# Patient Record
Sex: Male | Born: 1995 | Race: Black or African American | Hispanic: No | Marital: Single | Smoking: Never smoker
Health system: Southern US, Community
[De-identification: ages and names within clinical notes are randomized; demographics above are authoritative.]

## PROBLEM LIST (undated history)

## (undated) DIAGNOSIS — I10 Essential (primary) hypertension: Secondary | ICD-10-CM

## (undated) DIAGNOSIS — R519 Headache, unspecified: Secondary | ICD-10-CM

## (undated) DIAGNOSIS — R200 Anesthesia of skin: Secondary | ICD-10-CM

## (undated) DIAGNOSIS — G5601 Carpal tunnel syndrome, right upper limb: Secondary | ICD-10-CM

## (undated) HISTORY — DX: Anesthesia of skin: R20.0

## (undated) HISTORY — DX: Carpal tunnel syndrome, right upper limb: G56.01

## (undated) HISTORY — DX: Headache, unspecified: R51.9

## (undated) HISTORY — PX: OTHER SURGICAL HISTORY: SHX169

---

## 2020-07-11 ENCOUNTER — Other Ambulatory Visit: Payer: Self-pay

## 2020-07-11 ENCOUNTER — Emergency Department (HOSPITAL_COMMUNITY)
Admission: EM | Admit: 2020-07-11 | Discharge: 2020-07-11 | Disposition: A | Discharge status: Home / Self Care | Attending: Emergency Medicine | Admitting: Emergency Medicine

## 2020-07-11 ENCOUNTER — Encounter (HOSPITAL_COMMUNITY): Payer: Self-pay | Admitting: Emergency Medicine

## 2020-07-11 ENCOUNTER — Other Ambulatory Visit (HOSPITAL_COMMUNITY): Admission: RE | Admit: 2020-07-11 | Source: Ambulatory Visit

## 2020-07-11 ENCOUNTER — Emergency Department (HOSPITAL_COMMUNITY)

## 2020-07-11 DIAGNOSIS — R072 Precordial pain: Secondary | ICD-10-CM | POA: Diagnosis not present

## 2020-07-11 DIAGNOSIS — Z20822 Contact with and (suspected) exposure to covid-19: Secondary | ICD-10-CM | POA: Insufficient documentation

## 2020-07-11 DIAGNOSIS — Z79899 Other long term (current) drug therapy: Secondary | ICD-10-CM | POA: Diagnosis not present

## 2020-07-11 DIAGNOSIS — R0789 Other chest pain: Secondary | ICD-10-CM | POA: Insufficient documentation

## 2020-07-11 DIAGNOSIS — I10 Essential (primary) hypertension: Secondary | ICD-10-CM | POA: Insufficient documentation

## 2020-07-11 HISTORY — DX: Essential (primary) hypertension: I10

## 2020-07-11 LAB — RESP PANEL BY RT-PCR (FLU A&B, COVID) ARPGX2
Influenza A by PCR: NEGATIVE
Influenza B by PCR: NEGATIVE
SARS Coronavirus 2 by RT PCR: NEGATIVE

## 2020-07-11 LAB — BASIC METABOLIC PANEL
Anion gap: 6 (ref 5–15)
BUN: 11 mg/dL (ref 6–20)
CO2: 29 mmol/L (ref 22–32)
Calcium: 9.6 mg/dL (ref 8.9–10.3)
Chloride: 100 mmol/L (ref 98–111)
Creatinine, Ser: 0.96 mg/dL (ref 0.61–1.24)
GFR, Estimated: >60 mL/min (ref >60)
Glucose, Bld: 147 mg/dL — ABNORMAL HIGH (ref 70–99)
Potassium: 3.4 mmol/L — ABNORMAL LOW (ref 3.5–5.1)
Sodium: 135 mmol/L (ref 135–145)

## 2020-07-11 LAB — CBC
HCT: 46.5 % (ref 39.0–52.0)
Hemoglobin: 16 g/dL (ref 13.0–17.0)
MCH: 30.2 pg (ref 26.0–34.0)
MCHC: 34.4 g/dL (ref 30.0–36.0)
MCV: 87.7 fL (ref 80.0–100.0)
Platelets: 263 10*3/uL (ref 150–400)
RBC: 5.3 MIL/uL (ref 4.22–5.81)
RDW: 13.4 % (ref 11.5–15.5)
WBC: 6 10*3/uL (ref 4.0–10.5)
nRBC: 0 % (ref 0.0–0.2)

## 2020-07-11 LAB — CBG MONITORING, ED: Glucose-Capillary: 133 mg/dL — ABNORMAL HIGH (ref 70–99)

## 2020-07-11 LAB — TROPONIN I (HIGH SENSITIVITY)
Troponin I (High Sensitivity): 2 ng/L (ref <18)
Troponin I (High Sensitivity): <2 ng/L (ref <18)

## 2020-07-11 MED ORDER — NAPROXEN 500 MG PO TABS: ORAL_TABLET | ORAL | 0 refills | Status: AC

## 2020-07-11 NOTE — Discharge Instructions (Addendum)
Follow-up with your family doctor next week for recheck. 

## 2020-07-11 NOTE — ED Triage Notes (Signed)
Pt c/o chest tightness for 3 days that has been intermittent but constant since last night. Reports sweating and nausea.

## 2020-07-11 NOTE — ED Provider Notes (Signed)
[facility]-EMERGENCY DEPT Provider Note   CSN: [id number] Arrival date & time: 07/11/20  1126     History Chief Complaint  Patient presents with  . Chest Pain    Caleb Patton is a 25 y.o. male.  Patient complains of chest discomfort for the last 3 days with it being worse for last 12 hours.  Patient has a history of hypertension.  No shortness of breath no sweating  The history is provided by the patient and medical records. No language interpreter was used.  Chest Pain Pain location:  Substernal area Pain quality: aching   Pain radiates to:  Does not radiate Pain severity:  Moderate Onset quality:  Sudden Timing:  Constant Progression:  Worsening Chronicity:  New Context: not breathing   Associated symptoms: no abdominal pain, no back pain, no cough, no fatigue and no headache        Past Medical History:  Diagnosis Date  . Hypertension     There are no problems to display for this patient.   History reviewed. No pertinent surgical history.     No family history on file.  Social History   Tobacco Use  . Smoking status: Never Smoker  . Smokeless tobacco: Never Used  Vaping Use  . Vaping Use: Every day  . Substances: Nicotine    Home Medications Prior to Admission medications   Medication Sig Start Date End Date Taking? Authorizing Provider  hydrochlorothiazide (HYDRODIURIL) 25 MG tablet Take 12.5 mg by mouth daily.   Yes [provider]  lisinopril (ZESTRIL) 20 MG tablet Take 20 mg by mouth daily.   Yes [provider]  naproxen (NAPROSYN) 500 MG tablet Take 1 pill every 12 hours if needed for pain not relieved by Tylenol 07/11/20  Yes Bethann Berkshire, MD    Allergies    Patient has no known allergies.  Review of Systems   Review of Systems  Constitutional: Negative for appetite change and fatigue.  HENT: Negative for congestion, ear discharge and sinus pressure.   Eyes: Negative for discharge.   Respiratory: Negative for cough.   Cardiovascular: Positive for chest pain.  Gastrointestinal: Negative for abdominal pain and diarrhea.  Genitourinary: Negative for frequency and hematuria.  Musculoskeletal: Negative for back pain.  Skin: Negative for rash.  Neurological: Negative for seizures and headaches.  Psychiatric/Behavioral: Negative for hallucinations.    Physical Exam Updated Vital Signs BP 125/61 (BP Location: Right Arm)   Pulse 82   Temp 98 F (36.7 C)   Resp 15   Ht 5\' 5"  (1.651 m)   Wt 83.4 kg   SpO2 99%   BMI 30.60 kg/m   Physical Exam Vitals and nursing note reviewed.  Constitutional:      Appearance: He is well-developed.  HENT:     Head: Normocephalic.     Mouth/Throat:     Mouth: Mucous membranes are moist.  Eyes:     General: No scleral icterus.    Conjunctiva/sclera: Conjunctivae normal.  Neck:     Thyroid: No thyromegaly.  Cardiovascular:     Rate and Rhythm: Normal rate and regular rhythm.     Heart sounds: No murmur heard. No friction rub. No gallop.   Pulmonary:     Breath sounds: No stridor. No wheezing or rales.  Chest:     Chest wall: No tenderness.  Abdominal:     General: There is no distension.     Tenderness: There is no abdominal tenderness. There is no rebound.  Musculoskeletal:        General: Normal range of motion.     Cervical back: Neck supple.  Lymphadenopathy:     Cervical: No cervical adenopathy.  Skin:    Findings: No erythema or rash.  Neurological:     Mental Status: He is alert and oriented to person, place, and time.     Motor: No abnormal muscle tone.     Coordination: Coordination normal.  Psychiatric:        Behavior: Behavior normal.     ED Results / Procedures / Treatments   Labs (all labs ordered are listed, but only abnormal results are displayed) Labs Reviewed  BASIC METABOLIC PANEL - Abnormal; Notable for the following components:      Result Value   Potassium 3.4 (*)    Glucose, Bld 147  (*)    All other components within normal limits  CBG MONITORING, ED - Abnormal; Notable for the following components:   Glucose-Capillary 133 (*)    All other components within normal limits  RESP PANEL BY RT-PCR (FLU A&B, COVID) ARPGX2  CBC  TROPONIN I (HIGH SENSITIVITY)  TROPONIN I (HIGH SENSITIVITY)    EKG  Radiology DG Chest 2 View  Result Date: 07/11/2020 CLINICAL DATA:  Chest pain for 3 days. EXAM: CHEST - 2 VIEW COMPARISON:  None. FINDINGS: The cardiac silhouette, mediastinal and hilar contours are normal. The lungs are clear. No pleural effusions. The bony thorax is intact. IMPRESSION: No acute cardiopulmonary findings. Electronically Signed   By: Rudie Meyer M.D.   On: 07/11/2020 13:11    Procedures Procedures   Medications Ordered in ED Medications - No data to display  ED Course  I have reviewed the triage vital signs and the nursing notes.  Pertinent labs & imaging results that were available during my care of the patient were reviewed by me and considered in my medical decision making (see chart for details). Initial EKG had some ST elevation that looked more like repolarization.  I spoke with cardiology and they agreed with that.  Labs and x-rays unremarkable.  This is not cardiac chest discomfort.  He is sent home with some Naprosyn will follow up with PCP   MDM Rules/Calculators/A&P                          Atypical chest pain.  Patient given Naprosyn and will follow up with PCP Final Clinical Impression(s) / ED Diagnoses Final diagnoses:  Atypical chest pain    Rx / DC Orders ED Discharge Orders         Ordered    naproxen (NAPROSYN) 500 MG tablet        07/11/20 1504           Bethann Berkshire, MD 07/14/20 1708

## 2020-07-13 ENCOUNTER — Encounter (HOSPITAL_COMMUNITY): Payer: Self-pay | Admitting: Emergency Medicine

## 2020-07-13 ENCOUNTER — Ambulatory Visit (HOSPITAL_COMMUNITY)
Admission: EM | Admit: 2020-07-13 | Discharge: 2020-07-13 | Disposition: A | Discharge status: Home / Self Care | Attending: Emergency Medicine | Admitting: Emergency Medicine

## 2020-07-13 ENCOUNTER — Other Ambulatory Visit: Payer: Self-pay

## 2020-07-13 DIAGNOSIS — R5383 Other fatigue: Secondary | ICD-10-CM | POA: Diagnosis not present

## 2020-07-13 DIAGNOSIS — R634 Abnormal weight loss: Secondary | ICD-10-CM | POA: Insufficient documentation

## 2020-07-13 LAB — TSH: TSH: 1.23 u[IU]/mL (ref 0.350–4.500)

## 2020-07-13 LAB — HEPATITIS PANEL, ACUTE
HCV Ab: NONREACTIVE
Hep A IgM: NONREACTIVE
Hep B C IgM: NONREACTIVE
Hepatitis B Surface Ag: NONREACTIVE

## 2020-07-13 LAB — POCT URINALYSIS DIPSTICK, ED / UC
Bilirubin Urine: NEGATIVE
Glucose, UA: NEGATIVE mg/dL
Hgb urine dipstick: NEGATIVE
Ketones, ur: NEGATIVE mg/dL
Leukocytes,Ua: NEGATIVE
Nitrite: NEGATIVE
Protein, ur: NEGATIVE mg/dL
Specific Gravity, Urine: 1.01 (ref 1.005–1.030)
Urobilinogen, UA: 0.2 mg/dL (ref 0.0–1.0)
pH: 6 (ref 5.0–8.0)

## 2020-07-13 LAB — HIV ANTIBODY (ROUTINE TESTING W REFLEX): HIV Screen 4th Generation wRfx: NONREACTIVE

## 2020-07-13 NOTE — Discharge Instructions (Addendum)
Today you are being checked for gonorrhea, chlamydia, trichomonas, hepatitis, HIV, syphilis and your thyroid, labs pending 2 to 3 days, you will be notified of any concerning results  Please refrain from sex until STD labs return, if positive for anything please refrain from sex until treatment complete, please notify partner if positive so that they may be treated as well  Follow-up with primary care provider if symptoms persist past 1 week  Please weigh self daily at the same time and record, so that your physician will have a track record of your weight loss

## 2020-07-13 NOTE — ED Triage Notes (Signed)
Patient reports feeling fatigued on Monday.  Patient seen at [facility] long ED on 07/11/2020 for different concerns.  Patient has fatigue, concerns for weight loss.  Patient suppo0rts this concern with a 6 pound loss since Monday.  Patient is asking for std testing.  Patient denies symptoms.

## 2020-07-13 NOTE — ED Provider Notes (Signed)
[facility]    CSN: [id number] Arrival date & time: 07/13/20  1114      History   Chief Complaint Chief Complaint  Patient presents with  . Fatigue  . Weight Loss  . SEXUALLY TRANSMITTED DISEASE    HPI Caleb Patton is a 25 y.o. male.   Patient presents with fatigue, rapid weight loss and body aches beginning Monday. Weighed 188 on Sunday, today weighed at 181. Works out 1-2 hours daily. Denies dietary changes and that recently has been snacking more and eating fast foods, Denies fever, headaches, N/V/D, abdominal pain, shortness of breath, recent travel. Recently seen for chest pain in ED on 5/10 where complete work up was done. Requesting sti screening. Unsure if this may be related. 1 partner, no condom use. Last encounter 3 weeks ago. Denies discharge, itching,dysuria, penile or scrotal swelling, rash or lesions.   Past Medical History:  Diagnosis Date  . Hypertension     There are no problems to display for this patient.   History reviewed. No pertinent surgical history.     Home Medications    Prior to Admission medications   Medication Sig Start Date End Date Taking? Authorizing Provider  azithromycin (ZITHROMAX) 250 MG tablet Take by mouth daily.   Yes [provider]  hydrochlorothiazide (HYDRODIURIL) 25 MG tablet Take 12.5 mg by mouth daily.   Yes [provider]  lisinopril (ZESTRIL) 20 MG tablet Take 20 mg by mouth daily.   Yes [provider]  naproxen (NAPROSYN) 500 MG tablet Take 1 pill every 12 hours if needed for pain not relieved by Tylenol 07/11/20  Yes Bethann Berkshire, MD    Family History Family History  Problem Relation Age of Onset  . Hypertension Mother   . Cancer Mother   . Hypertension Father     Social History Social History   Tobacco Use  . Smoking status: Never Smoker  . Smokeless tobacco: Never Used  Vaping Use  . Vaping Use: Every day  . Substances: Nicotine  Substance Use Topics  .  Alcohol use: Never  . Drug use: Never     Allergies   Patient has no known allergies.   Review of Systems Review of Systems  Constitutional: Positive for fatigue and unexpected weight change. Negative for activity change, appetite change, chills, diaphoresis and fever.  HENT: Negative.   Respiratory: Negative.   Cardiovascular: Negative.   Gastrointestinal: Negative.   Musculoskeletal: Negative.   Skin: Negative.   Neurological: Negative.      Physical Exam Triage Vital Signs ED Triage Vitals  Enc Vitals Group     BP 07/13/20 1157 138/89     Pulse Rate 07/13/20 1157 (!) 111     Resp 07/13/20 1157 20     Temp 07/13/20 1157 98.5 F (36.9 C)     Temp Source 07/13/20 1157 Oral     SpO2 07/13/20 1157 100 %     Weight --      Height --      Head Circumference --      Peak Flow --      Pain Score 07/13/20 1153 2     Pain Loc --      Pain Edu? --      Excl. in GC? --    No data found.  Updated Vital Signs BP 138/89 (BP Location: Left Arm) Comment (BP Location): large cuff  Pulse (!) 111   Temp 98.5 F (36.9 C) (Oral)   Resp  20   SpO2 100%   Visual Acuity Right Eye Distance:   Left Eye Distance:   Bilateral Distance:    Right Eye Near:   Left Eye Near:    Bilateral Near:     Physical Exam Constitutional:      Appearance: Normal appearance. He is normal weight.  HENT:     Head: Normocephalic.     Right Ear: Tympanic membrane, ear canal and external ear normal.     Left Ear: Tympanic membrane, ear canal and external ear normal.     Nose: Nose normal.     Mouth/Throat:     Mouth: Mucous membranes are moist.     Pharynx: Oropharynx is clear.  Eyes:     Extraocular Movements: Extraocular movements intact.     Conjunctiva/sclera: Conjunctivae normal.     Pupils: Pupils are equal, round, and reactive to light.  Cardiovascular:     Rate and Rhythm: Normal rate and regular rhythm.     Pulses: Normal pulses.     Heart sounds: Normal heart sounds.   Pulmonary:     Effort: Pulmonary effort is normal.     Breath sounds: Normal breath sounds.  Musculoskeletal:        General: Normal range of motion.     Cervical back: Normal range of motion and neck supple.  Skin:    General: Skin is warm and dry.  Neurological:     General: No focal deficit present.     Mental Status: He is alert and oriented to person, place, and time. Mental status is at baseline.  Psychiatric:        Mood and Affect: Mood normal.        Behavior: Behavior normal.        Thought Content: Thought content normal.        Judgment: Judgment normal.      UC Treatments / Results  Labs (all labs ordered are listed, but only abnormal results are displayed) Labs Reviewed  HIV ANTIBODY (ROUTINE TESTING W REFLEX)  RPR  HEPATITIS PANEL, ACUTE  TSH  POCT URINALYSIS DIPSTICK, ED / UC  CYTOLOGY, (ORAL, ANAL, URETHRAL) ANCILLARY ONLY    EKG   Radiology No results found.  Procedures Procedures (including critical care time)  Medications Ordered in UC Medications - No data to display  Initial Impression / Assessment and Plan / UC Course  I have reviewed the triage vital signs and the nursing notes.  Pertinent labs & imaging results that were available during my care of the patient were reviewed by me and considered in my medical decision making (see chart for details).  Fatigue  Loss of weight  Reviewed all lab work for ED visit on 07/11/20. Unremarkable.   1. STI screening pending, advised abstinence until labs result and/or treatment complete 2. Urinalysis- negative 3. TSH- pending 4. Hepatitis panel - pending 5. Follow up in 1 week with PCP if symptoms still present   Final Clinical Impressions(s) / UC Diagnoses   Final diagnoses:  Fatigue, unspecified type  Loss of weight     Discharge Instructions     Today you are being checked for gonorrhea, chlamydia, trichomonas, hepatitis, HIV, syphilis and your thyroid, labs pending 2 to 3 days,  you will be notified of any concerning results  Please refrain from sex until STD labs return, if positive for anything please refrain from sex until treatment complete, please notify partner if positive so that they may be treated as well  Follow-up with primary care provider if symptoms persist past 1 week  Please weigh self daily at the same time and record, so that your physician will have a track record of your weight loss   ED Prescriptions    None     PDMP not reviewed this encounter.   Valinda Hoar, NP 07/13/20 1329

## 2020-07-14 LAB — CYTOLOGY, (ORAL, ANAL, URETHRAL) ANCILLARY ONLY
Chlamydia: NEGATIVE
Comment: NEGATIVE
Comment: NEGATIVE
Comment: NORMAL
Neisseria Gonorrhea: NEGATIVE
Trichomonas: NEGATIVE

## 2020-07-14 LAB — RPR: RPR Ser Ql: NONREACTIVE

## 2020-07-17 ENCOUNTER — Emergency Department (HOSPITAL_COMMUNITY)
Admission: EM | Admit: 2020-07-17 | Discharge: 2020-07-17 | Disposition: A | Discharge status: Home / Self Care | Attending: Emergency Medicine | Admitting: Emergency Medicine

## 2020-07-17 ENCOUNTER — Encounter (HOSPITAL_COMMUNITY): Payer: Self-pay | Admitting: Emergency Medicine

## 2020-07-17 ENCOUNTER — Other Ambulatory Visit: Payer: Self-pay

## 2020-07-17 ENCOUNTER — Ambulatory Visit: Payer: Self-pay | Admitting: *Deleted

## 2020-07-17 DIAGNOSIS — R202 Paresthesia of skin: Secondary | ICD-10-CM

## 2020-07-17 DIAGNOSIS — E538 Deficiency of other specified B group vitamins: Secondary | ICD-10-CM

## 2020-07-17 DIAGNOSIS — I10 Essential (primary) hypertension: Secondary | ICD-10-CM | POA: Diagnosis not present

## 2020-07-17 DIAGNOSIS — R519 Headache, unspecified: Secondary | ICD-10-CM | POA: Diagnosis not present

## 2020-07-17 DIAGNOSIS — R29810 Facial weakness: Secondary | ICD-10-CM | POA: Insufficient documentation

## 2020-07-17 LAB — CBC WITH DIFFERENTIAL/PLATELET
Abs Immature Granulocytes: 0.02 10*3/uL (ref 0.00–0.07)
Basophils Absolute: 0 10*3/uL (ref 0.0–0.1)
Basophils Relative: 1 %
Eosinophils Absolute: 0.1 10*3/uL (ref 0.0–0.5)
Eosinophils Relative: 2 %
HCT: 42.9 % (ref 39.0–52.0)
Hemoglobin: 14.5 g/dL (ref 13.0–17.0)
Immature Granulocytes: 0 %
Lymphocytes Relative: 48 %
Lymphs Abs: 2.9 10*3/uL (ref 0.7–4.0)
MCH: 30.1 pg (ref 26.0–34.0)
MCHC: 33.8 g/dL (ref 30.0–36.0)
MCV: 89 fL (ref 80.0–100.0)
Monocytes Absolute: 0.5 10*3/uL (ref 0.1–1.0)
Monocytes Relative: 8 %
Neutro Abs: 2.4 10*3/uL (ref 1.7–7.7)
Neutrophils Relative %: 41 %
Platelets: 235 10*3/uL (ref 150–400)
RBC: 4.82 MIL/uL (ref 4.22–5.81)
RDW: 13.1 % (ref 11.5–15.5)
WBC: 6 10*3/uL (ref 4.0–10.5)
nRBC: 0 % (ref 0.0–0.2)

## 2020-07-17 LAB — BASIC METABOLIC PANEL
Anion gap: 6 (ref 5–15)
BUN: 14 mg/dL (ref 6–20)
CO2: 30 mmol/L (ref 22–32)
Calcium: 9.2 mg/dL (ref 8.9–10.3)
Chloride: 100 mmol/L (ref 98–111)
Creatinine, Ser: 0.73 mg/dL (ref 0.61–1.24)
GFR, Estimated: >60 mL/min (ref >60)
Glucose, Bld: 102 mg/dL — ABNORMAL HIGH (ref 70–99)
Potassium: 3.4 mmol/L — ABNORMAL LOW (ref 3.5–5.1)
Sodium: 136 mmol/L (ref 135–145)

## 2020-07-17 LAB — MAGNESIUM: Magnesium: 2.1 mg/dL (ref 1.7–2.4)

## 2020-07-17 LAB — VITAMIN B12: Vitamin B-12: 111 pg/mL — ABNORMAL LOW (ref 180–914)

## 2020-07-17 MED ORDER — POTASSIUM CHLORIDE CRYS ER 20 MEQ PO TBCR
40.0000 meq | EXTENDED_RELEASE_TABLET | Freq: Once | ORAL | Status: AC
  Administered 2020-07-17: 40 meq via ORAL
  Filled 2020-07-17: qty 2

## 2020-07-17 MED ORDER — DIPHENHYDRAMINE HCL 50 MG/ML IJ SOLN
25.0000 mg | Freq: Once | INTRAMUSCULAR | Status: AC
  Administered 2020-07-17: 25 mg via INTRAVENOUS
  Filled 2020-07-17: qty 1

## 2020-07-17 MED ORDER — VITAMIN B-12 1000 MCG PO TABS: 1000.0000 ug | ORAL_TABLET | Freq: Every day | ORAL | 0 refills | Status: AC

## 2020-07-17 MED ORDER — METOCLOPRAMIDE HCL 5 MG/ML IJ SOLN
10.0000 mg | Freq: Once | INTRAMUSCULAR | Status: AC
  Administered 2020-07-17: 10 mg via INTRAVENOUS
  Filled 2020-07-17: qty 2

## 2020-07-17 NOTE — ED Provider Notes (Signed)
Care assumed from [facility], PA-C at shift change pending labs. See her note for full HPI.  In short, patient is a 25 year old male who presents to the ED due to left-sided facial numbness which has been intermittent in nature for the past few days.  Patient states symptoms worsened on Sunday and became more constant.  Left-sided facial paresthesias associated with headache.  Patient has a history of migraines.  No photophobia phonophobia, nausea, vomiting, visual changes, speech changes, unilateral weakness.  No family history of MS.  Plan from previous provider: Follow-up with labs. If labs are normal and numbness/tingling improve after migraine cocktail, discharge with outpatient neurology follow-up. If labs normal and numbness/tingling continues after treatment reassess for possible MRI brain to rule out MS.    Physical Exam  BP 136/78   Pulse 66   Temp 98.4 F (36.9 C) (Oral)   Resp 20   Ht 5\' 6"  (1.676 m)   Wt 83.9 kg   SpO2 99%   BMI 29.86 kg/m   Physical Exam Vitals and nursing note reviewed.  Constitutional:      General: He is not in acute distress.    Appearance: He is not ill-appearing.  HENT:     Head: Normocephalic.     Mouth/Throat:     Comments: Mild decrease in facial sensation on left. No facial droop.  Eyes:     Extraocular Movements: Extraocular movements intact.     Pupils: Pupils are equal, round, and reactive to light.  Cardiovascular:     Rate and Rhythm: Normal rate and regular rhythm.     Pulses: Normal pulses.     Heart sounds: Normal heart sounds. No murmur heard. No friction rub. No gallop.   Pulmonary:     Effort: Pulmonary effort is normal.     Breath sounds: Normal breath sounds.  Abdominal:     General: Abdomen is flat. There is no distension.     Palpations: Abdomen is soft.     Tenderness: There is no abdominal tenderness. There is no guarding or rebound.  Musculoskeletal:        General: Normal range of motion.     Cervical  back: Neck supple.  Skin:    General: Skin is warm and dry.  Neurological:     General: No focal deficit present.     Mental Status: He is alert.     Comments: Speech is clear, able to follow commands CN III-XII intact Normal strength in upper and lower extremities bilaterally including dorsiflexion and plantar flexion, strong and equal grip strength Sensation grossly intact throughout Moves extremities without ataxia, coordination intact No pronator drift Ambulates without difficulty  Psychiatric:        Mood and Affect: Mood normal.        Behavior: Behavior normal.     ED Course/Procedures   Clinical Course as of 07/17/20 0804  Mon Jul 17, 2020  Jul 19, 2020 Vitamin B12(!): 111 [CA]  0802 Potassium(!): 3.4 [CA]    Clinical Course User Index [CA] 0803, PA-C    Procedures  MDM  Care assumed from [facility], PA-C at shift change pending labs. See her note for full MDM.  25 year old male presents to the ED due to intermittent left-sided facial paresthesias for the past few days.  No facial droop, changes to speech, changes to vision, or unilateral weakness.  Upon arrival, stable vitals.  Patient in no acute distress and non-ill-appearing.  Physical exam reassuring.  Normal neurological  exam. No facial droop. Normal speech. No signs of Bell's palsy or CVA on exam.  I personally reviewed all labs ordered by previous provider.  B12 deficiency at 111 likely causing facial paresthesias.  BMP significant for mild hypokalemia 3.4 and hyperglycemia at 102.  No anion gap.  Doubt DKA.  Potassium repleted here in the ED.  Magnesium normal at 2.1.  CBC unremarkable with no leukocytosis and normal hemoglobin. Patient admits to improvement in symptoms after migraine cocktail. Patient discharged with B12 supplements. Neurology outpatient follow-up given to patient due to history of migraines. Strict ED precautions discussed with patient. Patient states understanding and agrees to  plan. Patient discharged home in no acute distress and stable vitals          Jesusita Oka 07/17/20 3354    Gilda Crease, MD 07/18/20 [phone]

## 2020-07-17 NOTE — Discharge Instructions (Addendum)
As discussed, your B12 and potassium were lower than normal which could be causing your numbness/tingling. I am sending you home with B12 vitamins. Take daily. Follow-up with PCP within the next week for further evaluation. Return to the ER for new or worsening symptoms.   I have included the number of the neurologist. Call to schedule an appointment for further evaluation of your migraines.

## 2020-07-17 NOTE — ED Triage Notes (Signed)
Patient arrives complaining of left sided facial numbness. Patient states that began 2 days ago. Patient states he was recently seen for fatigue and sudden weight loss, but states work up was normal. Patient noted to have some paralysis when smiling.

## 2020-07-17 NOTE — Telephone Encounter (Signed)
  I returned pt's call.   He was prescribed vitamin B12 during his visit to the ED.   He didn't know how much he was supposed to take. I looked on his after visit summary and let him know it was 1000 mcg daily as a tablet.   It was called into the [facility] on [address] He thanked me for calling him back and had no other questions.   Reason for Disposition . Caller has medicine question only, adult not sick, AND triager answers question    B12 dosage prescribed to him in the ED  Answer Assessment - Initial Assessment Questions 1. NAME of MEDICATION: "What medicine are you calling about?"     B12 prescribed from the ED 2. QUESTION: "What is your question?" (e.g., double dose of medicine, side effect)     How much am I supposed to take? 3. PRESCRIBING HCP: "Who prescribed it?" Reason: if prescribed by specialist, call should be referred to that group.     Dr in the emergency room 4. SYMPTOMS: "Do you have any symptoms?"     Numbness on left side of face is reason it was prescribed. 5. SEVERITY: If symptoms are present, ask "Are they mild, moderate or severe?"     N/A 6. PREGNANCY:  "Is there any chance that you are pregnant?" "When was your last menstrual period?"     N/A  Protocols used: MEDICATION QUESTION CALL-A-AH

## 2020-07-17 NOTE — ED Provider Notes (Signed)
[facility]-EMERGENCY DEPT Provider Note   CSN: [id number] Arrival date & time: 07/17/20  0300     History Chief Complaint  Patient presents with  . Numbness    Caleb Patton is a 25 y.o. male presents to the Emergency Department complaining of gradual, persistent, left facial numbness onset earlier in the week.  Patient reports 5 days of intermittent numbness to the left cheek without other symptoms.  He reports that they worsened on Sunday and became constant becoming more noticeable between 2 and 3 AM.  Patient reports he has had a headache all day today.  Reports it is similar to his migraines.  Denies photophobia, phonophobia, nausea, vomiting, vision changes, other areas of numbness, weakness, loss of bowel or bladder control.  Patient denies aggravating or alleviating factors.  No treatments prior to arrival.  Denies a family history of MS.  Has never seen a neurologist for his migraine headaches.  Normally takes Tylenol for these. Reports he has a history of hypertension.  Takes his meds as directed however does regularly get migraine headaches when he forgets to take his medications.    The history is provided by the patient and medical records. No language interpreter was used.       Past Medical History:  Diagnosis Date  . Hypertension     There are no problems to display for this patient.   History reviewed. No pertinent surgical history.     Family History  Problem Relation Age of Onset  . Hypertension Mother   . Cancer Mother   . Hypertension Father     Social History   Tobacco Use  . Smoking status: Never Smoker  . Smokeless tobacco: Never Used  Vaping Use  . Vaping Use: Every day  . Substances: Nicotine  Substance Use Topics  . Alcohol use: Never  . Drug use: Never    Home Medications Prior to Admission medications   Medication Sig Start Date End Date Taking? Authorizing Provider  azithromycin (ZITHROMAX) 250 MG tablet Take by  mouth daily.    [provider]  hydrochlorothiazide (HYDRODIURIL) 25 MG tablet Take 12.5 mg by mouth daily.    [provider]  lisinopril (ZESTRIL) 20 MG tablet Take 20 mg by mouth daily.    [provider]  naproxen (NAPROSYN) 500 MG tablet Take 1 pill every 12 hours if needed for pain not relieved by Tylenol 07/11/20   Bethann Berkshire, MD    Allergies    Patient has no known allergies.  Review of Systems   Review of Systems  Constitutional: Negative for appetite change, diaphoresis, fatigue, fever and unexpected weight change.  HENT: Negative for mouth sores.   Eyes: Negative for visual disturbance.  Respiratory: Negative for cough, chest tightness, shortness of breath and wheezing.   Cardiovascular: Negative for chest pain.  Gastrointestinal: Negative for abdominal pain, constipation, diarrhea, nausea and vomiting.  Endocrine: Negative for polydipsia, polyphagia and polyuria.  Genitourinary: Negative for dysuria, frequency, hematuria and urgency.  Musculoskeletal: Negative for back pain and neck stiffness.  Skin: Negative for rash.  Allergic/Immunologic: Negative for immunocompromised state.  Neurological: Positive for numbness and headaches. Negative for syncope and light-headedness.  Hematological: Does not bruise/bleed easily.  Psychiatric/Behavioral: Negative for sleep disturbance. The patient is not nervous/anxious.     Physical Exam Updated Vital Signs BP 128/76   Pulse 69   Temp 98.4 F (36.9 C) (Oral)   Resp 20   Ht 5\' 6"  (1.676 m)  Wt 83.9 kg   SpO2 100%   BMI 29.86 kg/m   Physical Exam Vitals and nursing note reviewed.  Constitutional:      General: He is not in acute distress.    Appearance: He is well-developed. He is not diaphoretic.  HENT:     Head: Normocephalic and atraumatic.      Comments: No rash to the left side of the face, in the ear canal or the tip of the nose.    Right Ear: Tympanic membrane and ear canal normal.      Left Ear: Tympanic membrane and ear canal normal.     Nose: Nose normal.  Eyes:     General: No scleral icterus.    Conjunctiva/sclera: Conjunctivae normal.     Pupils: Pupils are equal, round, and reactive to light.     Comments: No horizontal, vertical or rotational nystagmus  Neck:     Comments: Full active and passive ROM without pain No midline or paraspinal tenderness No nuchal rigidity or meningeal signs Cardiovascular:     Rate and Rhythm: Normal rate and regular rhythm.  Pulmonary:     Effort: Pulmonary effort is normal. No respiratory distress.     Breath sounds: Normal breath sounds. No wheezing or rales.  Abdominal:     General: Bowel sounds are normal.     Palpations: Abdomen is soft.     Tenderness: There is no abdominal tenderness. There is no guarding or rebound.  Musculoskeletal:        General: Normal range of motion.     Cervical back: Normal range of motion and neck supple.  Lymphadenopathy:     Cervical: No cervical adenopathy.  Skin:    General: Skin is warm and dry.     Findings: No rash.  Neurological:     Mental Status: He is alert and oriented to person, place, and time.     Cranial Nerves: No cranial nerve deficit.     Motor: No abnormal muscle tone.     Coordination: Coordination normal.     Comments: Mental Status:  Alert, oriented, thought content appropriate. Speech fluent without evidence of aphasia. Able to follow 2 step commands without difficulty.  Cranial Nerves:  II:  Peripheral visual fields grossly normal, pupils equal, round, reactive to light III,IV, VI: ptosis not present, extra-ocular motions intact bilaterally  V,VII: smile symmetric, facial light touch sensation equal VIII: hearing grossly normal bilaterally  IX,X: midline uvula rise  XI: bilateral shoulder shrug equal and strong XII: midline tongue extension  Motor:  5/5 in upper and lower extremities bilaterally including strong and equal grip strength and  dorsiflexion/plantar flexion Sensory: Pinprick and light touch normal in all extremities.  Cerebellar: normal finger-to-nose with bilateral upper extremities Gait: normal gait and balance CV: distal pulses palpable throughout   Psychiatric:        Behavior: Behavior normal.        Thought Content: Thought content normal.        Judgment: Judgment normal.     ED Results / Procedures / Treatments   Labs (all labs ordered are listed, but only abnormal results are displayed) Labs Reviewed  CBC WITH DIFFERENTIAL/PLATELET  BASIC METABOLIC PANEL  MAGNESIUM  VITAMIN B12    EKG None  Radiology No results found.  Procedures Procedures   Medications Ordered in ED Medications  metoCLOPramide (REGLAN) injection 10 mg (10 mg Intravenous Given 07/17/20 0634)  diphenhydrAMINE (BENADRYL) injection 25 mg (25 mg Intravenous Given 07/17/20 0635)  ED Course  I have reviewed the triage vital signs and the nursing notes.  Pertinent labs & imaging results that were available during my care of the patient were reviewed by me and considered in my medical decision making (see chart for details).    MDM Rules/Calculators/A&P                           Presents with several days of intermittent L constant left cheek numbness.  Normal neurologic exam otherwise.  Subjective sensation decreased to the left cheek.  No clinical evidence of stroke or Bell's palsy.  Patient does have a history of migraine headaches and has had a headache with this.  Differential includes complex migraine, multiple sclerosis, shingles.  No clinical evidence of shingles given rash and patient denies pain at the site.  Less likely CVA given young age and atypical symptoms. Will treat migraine headache and reassess.  Labs pending including magnesium and B12 levels.  6:47 AM At shift change care was transferred to [facility] who will follow pending studies, re-evaulate and determine disposition.     Final Clinical  Impression(s) / ED Diagnoses Final diagnoses:  Paresthesia    Rx / DC Orders ED Discharge Orders    None       Tari Lecount, Boyd Kerbs 07/17/20 6440    Gilda Crease, MD 07/17/20 [phone]

## 2020-07-20 ENCOUNTER — Other Ambulatory Visit: Payer: Self-pay

## 2020-07-20 ENCOUNTER — Ambulatory Visit (INDEPENDENT_AMBULATORY_CARE_PROVIDER_SITE_OTHER): Admitting: Neurology

## 2020-07-20 ENCOUNTER — Encounter: Payer: Self-pay | Admitting: Neurology

## 2020-07-20 VITALS — BP 117/73 | HR 97 | Ht 66.0 in | Wt 184.0 lb

## 2020-07-20 DIAGNOSIS — G43709 Chronic migraine without aura, not intractable, without status migrainosus: Secondary | ICD-10-CM

## 2020-07-20 DIAGNOSIS — R202 Paresthesia of skin: Secondary | ICD-10-CM

## 2020-07-20 MED ORDER — SUMATRIPTAN SUCCINATE 50 MG PO TABS: 50.0000 mg | ORAL_TABLET | ORAL | 6 refills | Status: DC | PRN

## 2020-07-20 NOTE — Progress Notes (Signed)
Chief Complaint  Patient presents with  . New Patient (Initial Visit)    ED follow up from severe headache and left-sided facial numbness on 07/17/20. He has continued to have a headache but the severity is less. No change in numbness. He was instructed to start B12 daily.      ASSESSMENT AND PLAN  Caleb Patton is a 25 y.o. male   New onset headaches Headache with associated left facial paresthesia  MRI of brain to rule out right hemisphere pathology  Differentiation diagnoses also include complicated migraine headaches  Imitrex 50 mg as needed, may combine it with Aleve  Right hand paresthesia  EMG nerve conduction study for evaluation of possible carpal tunnel syndromes  DIAGNOSTIC DATA (LABS, IMAGING, TESTING) - I reviewed patient records, labs, notes, testing and imaging myself where available.   HISTORICAL  Caleb Patton, is a 25 year old right-handed male, seen in request by his primary care physician Dr. Thomasena Edis, Annabelle Harman, for evaluation of right hand paresthesia, headache with associated left facial numbness, initial evaluation was on Jul 20, 2020.  I reviewed and summarized the referring note. PMHX. HTN.  He just retired from 6 years of [employer] service few months ago, currently work at [employer] also attending college,  Over the past few years, he has intermittent right hand paresthesia, getting worse over the past few months, sometimes With dense numbness involving his right hand, he has to shake his hands to make the sensation come back, was just given a right wrist splint, also prescription of steroid,  He presented to emergency room on Jul 17, 2020, for acute onset severe headache, right-sided severe pounding headache with light noise sensitivity, also left facial numbness, lasting for about 1 hour  Which was resolved by Reglan, Benadryl IV at emergency room  However since its onset, every night when he lies down, especially when his right  scalp touches pillow, he felt mild pounding headache,   PHYSICAL EXAM:   Vitals:   07/20/20 1404  BP: 117/73  Pulse: 97  Weight: 184 lb (83.5 kg)  Height: 5\' 6"  (1.676 m)   Not recorded     Body mass index is 29.7 kg/m.  PHYSICAL EXAMNIATION:  Gen: NAD, conversant, well nourised, well groomed                     Cardiovascular: Regular rate rhythm, no peripheral edema, warm, nontender. Eyes: Conjunctivae clear without exudates or hemorrhage Neck: Supple, no carotid bruits. Pulmonary: Clear to auscultation bilaterally   NEUROLOGICAL EXAM:  MENTAL STATUS: Speech:    Speech is normal; fluent and spontaneous with normal comprehension.  Cognition:     Orientation to time, place and person     Normal recent and remote memory     Normal Attention span and concentration     Normal Language, naming, repeating,spontaneous speech     Fund of knowledge   CRANIAL NERVES: CN II: Visual fields are full to confrontation. Pupils are round equal and briskly reactive to light. CN III, IV, VI: extraocular movement are normal. No ptosis. CN V: Facial sensation is intact to light touch CN VII: Face is symmetric with normal eye closure  CN VIII: Hearing is normal to causal conversation. CN IX, X: Phonation is normal. CN XI: Head turning and shoulder shrug are intact  MOTOR: There is no pronator drift of out-stretched arms. Muscle bulk and tone are normal. Muscle strength is normal.  Right wrist in splint  REFLEXES: Reflexes  are 2+ and symmetric at the biceps, triceps, knees, and ankles. Plantar responses are flexor.  SENSORY: Intact to light touch, pinprick and vibratory sensation are intact in fingers and toes.  COORDINATION: There is no trunk or limb dysmetria noted.  GAIT/STANCE: Posture is normal. Gait is steady with normal steps, base, arm swing, and turning. Heel and toe walking are normal. Tandem gait is normal.  Romberg is absent.  REVIEW OF SYSTEMS:  Full 14 system  review of systems performed and notable only for as above All other review of systems were negative.   ALLERGIES: No Known Allergies  HOME MEDICATIONS: Current Outpatient Medications  Medication Sig Dispense Refill  . hydrochlorothiazide (HYDRODIURIL) 25 MG tablet Take 12.5 mg by mouth daily.    Marland Kitchen lisinopril (ZESTRIL) 20 MG tablet Take 20 mg by mouth daily.    . naproxen (NAPROSYN) 500 MG tablet Take 1 pill every 12 hours if needed for pain not relieved by Tylenol 15 tablet 0  . vitamin B-12 (CYANOCOBALAMIN) 1000 MCG tablet Take 1 tablet (1,000 mcg total) by mouth daily. 30 tablet 0   No current facility-administered medications for this visit.    PAST MEDICAL HISTORY: Past Medical History:  Diagnosis Date  . Carpal tunnel syndrome, right   . Facial numbness   . Headache   . Hypertension     PAST SURGICAL HISTORY: Past Surgical History:  Procedure Laterality Date  . No past surgery.      FAMILY HISTORY: Family History  Problem Relation Age of Onset  . Hypertension Mother   . Ovarian cancer Mother   . Hypertension Father   . Other Father        "murdered"    SOCIAL HISTORY: Social History   Socioeconomic History  . Marital status: Single    Spouse name: Not on file  . Number of children: 0  . Years of education: two years college  . Highest education level: Not on file  Occupational History  . Occupation: stocks   Tobacco Use  . Smoking status: Never Smoker  . Smokeless tobacco: Never Used  Vaping Use  . Vaping Use: Every day  . Substances: Nicotine  Substance and Sexual Activity  . Alcohol use: Never  . Drug use: Never  . Sexual activity: Not on file  Other Topics Concern  . Not on file  Social History Narrative   Lives with a roommate.   No daily use of caffeine.   Right-handed.   Social Determinants of Health   Financial Resource Strain: Not on file  Food Insecurity: Not on file  Transportation Needs: Not on file  Physical Activity: Not on  file  Stress: Not on file  Social Connections: Not on file  Intimate Partner Violence: Not on file      Levert Feinstein, M.D. Ph.D.  [facility] [address], [address] [place], [place] [address] Ph: [phone] Fax: [phone]  CC:  Irena Reichmann, DO [address] [place],  [place] [address]  Irena Reichmann, DO

## 2020-07-20 NOTE — Patient Instructions (Signed)
Aleve

## 2020-08-08 ENCOUNTER — Other Ambulatory Visit: Payer: Self-pay

## 2020-08-08 ENCOUNTER — Ambulatory Visit
Admission: RE | Admit: 2020-08-08 | Discharge: 2020-08-08 | Disposition: A | Discharge status: Home / Self Care | Source: Ambulatory Visit | Attending: Neurology | Admitting: Neurology

## 2020-08-08 DIAGNOSIS — G43709 Chronic migraine without aura, not intractable, without status migrainosus: Secondary | ICD-10-CM

## 2020-08-08 DIAGNOSIS — R202 Paresthesia of skin: Secondary | ICD-10-CM

## 2020-08-10 ENCOUNTER — Telehealth: Payer: Self-pay | Admitting: *Deleted

## 2020-08-10 NOTE — Progress Notes (Signed)
IMPRESSION: No evidence of recent infarction, hemorrhage, mass, or other significant abnormality.   Normal head MRI -( for headache evaluation)

## 2020-08-10 NOTE — Telephone Encounter (Signed)
-----   Message from Melvyn Novas, MD sent at 08/10/2020  1:39 PM EDT ----- IMPRESSION: No evidence of recent infarction, hemorrhage, mass, or other significant abnormality.   Normal head MRI -( for headache evaluation)

## 2020-08-10 NOTE — Telephone Encounter (Signed)
Left patient a detailed message, with results, on voicemail (ok per DPR).  Provided our number to call back with any questions.  Also, instructed him to continue medications and keep his pending follow up.

## 2020-08-16 ENCOUNTER — Ambulatory Visit (INDEPENDENT_AMBULATORY_CARE_PROVIDER_SITE_OTHER): Admitting: Neurology

## 2020-08-16 ENCOUNTER — Encounter: Payer: Self-pay | Admitting: Neurology

## 2020-08-16 DIAGNOSIS — R202 Paresthesia of skin: Secondary | ICD-10-CM | POA: Diagnosis not present

## 2020-08-16 DIAGNOSIS — G43709 Chronic migraine without aura, not intractable, without status migrainosus: Secondary | ICD-10-CM | POA: Diagnosis not present

## 2020-08-16 DIAGNOSIS — G5601 Carpal tunnel syndrome, right upper limb: Secondary | ICD-10-CM

## 2020-08-16 MED ORDER — RIZATRIPTAN BENZOATE 10 MG PO TABS: 10.0000 mg | ORAL_TABLET | ORAL | 6 refills | Status: AC | PRN

## 2020-08-16 NOTE — Procedures (Signed)
Full Name: Caleb Patton Gender: Male MRN #: [id number] Date of Birth: June 22, 1995    Visit Date: 08/16/2020 09:01 Age: 25 Years Examining Physician: Levert Feinstein, MD  Referring Physician: Levert Feinstein, MD History: 25 year old male, right-handed, workout regularly including heavy weight lifting, complains of intermittent bilateral hands paresthesia, right worse than left  Summary of the test:  Nerve conduction study: Right median sensory response showed mildly prolonged peak latency, with normal snap amplitude.  Left median sensory and motor; bilateral ulnar sensory and motor; right median motor responses were normal.  Left median mixed response was within normal limit.  Electromyography: Selected needle examination of right upper extremity muscles were normal.  Conclusion: This is a mild abnormal study.  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    ------------------------------- Levert Feinstein M.D. PhD  [facility] [address] [place], [place] [address] Tel: [phone] Fax: [phone]  Verbal informed consent was obtained from the patient, patient was informed of potential risk of procedure, including bruising, bleeding, hematoma formation, infection, muscle weakness, muscle pain, numbness, among others.        MNC    Nerve / Sites Muscle Latency Ref. Amplitude Ref. Rel Amp Segments Distance Velocity Ref. Area    ms ms mV mV %  cm m/s m/s mVms  R Median - APB     Wrist APB 3.8 ?4.4 14.6 ?4.0 100 Wrist - APB 7   53.8     Upper arm APB 8.0  14.3  98 Upper arm - Wrist 22 53 ?49 51.0  L Median - APB     Wrist APB 3.5 ?4.4 9.3 ?4.0 100 Wrist - APB 7   40.5     Upper arm APB 7.3  8.1  86.6 Upper arm - Wrist 21 54 ?49 31.9  R Ulnar - ADM     Wrist ADM 2.6 ?3.3 9.4 ?6.0 100 Wrist - ADM 7   34.5     B.Elbow ADM 6.0  8.5  91 B.Elbow - Wrist 19 56 ?49 32.0      A.Elbow ADM 8.0  6.6  77.9 A.Elbow - B.Elbow 10 51 ?49 28.7           SNC    Nerve / Sites Rec. Site Peak Lat Ref.  Amp Ref. Segments Distance Peak Diff Ref.    ms ms V V  cm ms ms  L Median, Ulnar - Transcarpal comparison     Median Palm Wrist 2.1 ?2.2 60 ?35 Median Palm - Wrist 8       Ulnar Palm Wrist 1.9 ?2.2 12 ?12 Ulnar Palm - Wrist 8          Median Palm - Ulnar Palm  0.3 ?0.4  R Median - Orthodromic (Dig II, Mid palm)     Dig II Wrist 3.9 ?3.4 10 ?10 Dig II - Wrist 13    L Median - Orthodromic (Dig II, Mid palm)     Dig II Wrist 3.1 ?3.4 10 ?10 Dig II - Wrist 13    R Ulnar - Orthodromic, (Dig V, Mid palm)     Dig V Wrist 2.8 ?3.1 6 ?5 Dig V - Wrist 35               F  Wave    Nerve F Lat Ref.   ms ms  R Ulnar - ADM 27.3 ?32.0  EMG Summary Table    Spontaneous MUAP Recruitment  Muscle IA Fib PSW Fasc Other Amp Dur. Poly Pattern  R. First dorsal interosseous Normal None None None _______ Normal Normal Normal Normal  R. Abductor pollicis brevis Normal None None None _______ Normal Normal Normal Normal  R. Pronator teres Normal None None None _______ Normal Normal Normal Normal  R. Biceps brachii Normal None None None _______ Normal Normal Normal Normal  R. Deltoid Normal None None None _______ Normal Normal Normal Normal  R. Triceps brachii Normal None None None _______ Normal Normal Normal Normal

## 2020-08-16 NOTE — Progress Notes (Signed)
No chief complaint on file.     ASSESSMENT AND PLAN  Caleb Patton is a 25 y.o. male   New onset headaches Headache with associated left facial paresthesia  MRI of brain was normal, his headache has a lot of migraine features,  Suboptimal response to Imitrex 50 mg as needed,  Maxalt 10 mg as needed, may combine it with Aleve for severe prolonged headache,   Right hand paresthesia  EMG nerve conduction study confirmed mild right carpal tunnel syndrome, suggested him use wrist splint, avoid excessive weight lifting,  DIAGNOSTIC DATA (LABS, IMAGING, TESTING) - I reviewed patient records, labs, notes, testing and imaging myself where available.  MRI of Brain On August 08 2020 No evidence of recent infarction, hemorrhage, mass, or other significant abnormality.   HISTORICAL  Caleb Patton, is a 25 year old right-handed male, seen in request by his primary care physician Dr. Thomasena Edis, Annabelle Harman, for evaluation of right hand paresthesia, headache with associated left facial numbness, initial evaluation was on Jul 20, 2020.  I reviewed and summarized the referring note. PMHX. HTN.  He just retired from 6 years of [employer] service few months ago, currently work at [employer] also attending college,  Over the past few years, he has intermittent right hand paresthesia, getting worse over the past few months, sometimes With dense numbness involving his right hand, he has to shake his hands to make the sensation come back, was just given a right wrist splint, also prescription of steroid,  He presented to emergency room on Jul 17, 2020, for acute onset severe headache, right-sided severe pounding headache with light noise sensitivity, also left facial numbness, lasting for about 1 hour  Which was resolved by Reglan, Benadryl IV at emergency room  However since its onset, every night when he lies down, especially when his right scalp touches pillow, he felt mild pounding  headache  Update August 16, 2020: Personally reviewed MRI of the brain on September 09, 2019 that was normal  EMG nerve conduction study August 16, 2020 showed mild right carpal tunnel syndrome, he workout regularly, including heavy weightlifting, also wake up frequently at night ascending numbness from wrist up, he has to shake his right hand,  He complains of mild improvement of headaches, but still has frequent bifrontal left retro-orbital area pounding headache, was associated light noise sensitivity, mild nausea, movement made it worse, lasting for couple hours, has tried Imitrex 50 mg as needed, it does help his headache some, but often comes back in few hours, has to retake second dose, sleep always helps     PHYSICAL EXAM:   There were no vitals filed for this visit.  Not recorded     There is no height or weight on file to calculate BMI.  PHYSICAL EXAMNIATION:  Gen: NAD, conversant, well nourised, well groomed                NEUROLOGICAL EXAM:  MENTAL STATUS: Speech/cognition: Awake, alert, oriented to history taking and casual conversation   CRANIAL NERVES: CN II: Visual fields are full to confrontation. Pupils are round equal and briskly reactive to light. CN III, IV, VI: extraocular movement are normal. No ptosis. CN V: Facial sensation is intact to light touch CN VII: Face is symmetric with normal eye closure  CN VIII: Hearing is normal to causal conversation. CN IX, X: Phonation is normal. CN XI: Head turning and shoulder shrug are intact  MOTOR: Normal upper and lower extremity proximal and distal strengths  REFLEXES: Reflexes are 2+ and symmetric at the biceps, triceps, knees, and ankles. Plantar responses are flexor.  SENSORY: Intact to light touch, pinprick and vibratory sensation are intact in fingers and toes.  COORDINATION: There is no trunk or limb dysmetria noted.  GAIT/STANCE: Posture is normal. Gait is steady with normal steps, base, arm swing, and  turning. Heel and toe walking are normal. Tandem gait is normal.  Romberg is absent.  REVIEW OF SYSTEMS:  Full 14 system review of systems performed and notable only for as above All other review of systems were negative.   ALLERGIES: No Known Allergies  HOME MEDICATIONS: Current Outpatient Medications  Medication Sig Dispense Refill   rizatriptan (MAXALT) 10 MG tablet Take 1 tablet (10 mg total) by mouth as needed for migraine. May repeat in 2 hours if needed 12 tablet 6   hydrochlorothiazide (HYDRODIURIL) 25 MG tablet Take 12.5 mg by mouth daily.     lisinopril (ZESTRIL) 20 MG tablet Take 20 mg by mouth daily.     naproxen (NAPROSYN) 500 MG tablet Take 1 pill every 12 hours if needed for pain not relieved by Tylenol 15 tablet 0   vitamin B-12 (CYANOCOBALAMIN) 1000 MCG tablet Take 1 tablet (1,000 mcg total) by mouth daily. 30 tablet 0   No current facility-administered medications for this visit.    PAST MEDICAL HISTORY: Past Medical History:  Diagnosis Date   Carpal tunnel syndrome, right    Facial numbness    Headache    Hypertension     PAST SURGICAL HISTORY: Past Surgical History:  Procedure Laterality Date   No past surgery.      FAMILY HISTORY: Family History  Problem Relation Age of Onset   Hypertension Mother    Ovarian cancer Mother    Hypertension Father    Other Father        "murdered"    SOCIAL HISTORY: Social History   Socioeconomic History   Marital status: Single    Spouse name: Not on file   Number of children: 0   Years of education: two years college   Highest education level: Not on file  Occupational History   Occupation: stocks   Tobacco Use   Smoking status: Never   Smokeless tobacco: Never  Vaping Use   Vaping Use: Every day   Substances: Nicotine  Substance and Sexual Activity   Alcohol use: Never   Drug use: Never   Sexual activity: Not on file  Other Topics Concern   Not on file  Social History Narrative   Lives with  a roommate.   No daily use of caffeine.   Right-handed.   Social Determinants of Health   Financial Resource Strain: Not on file  Food Insecurity: Not on file  Transportation Needs: Not on file  Physical Activity: Not on file  Stress: Not on file  Social Connections: Not on file  Intimate Partner Violence: Not on file      Levert Feinstein, M.D. Ph.D.  [facility] [address], [address] [place], [place] [address] Ph: [phone] Fax: [phone]  CC:  Irena Reichmann, DO [address] [place],  [place] [address]  Irena Reichmann, DO

## 2023-01-28 IMAGING — MR MR HEAD W/O CM
11 series · 48 of 48 positions shown · non-contrast
Comparison: None.

CLINICAL DATA: Headache

EXAM:
MRI HEAD WITHOUT CONTRAST
TECHNIQUE: Multiplanar, multiecho pulse sequences of the brain and surrounding
structures were obtained without intravenous contrast.

[Series 5: T1 · sagittal · 4.0mm · 0.75mm/px · 2 of 31 slices shown (1 of 2)]
[im 1/31]
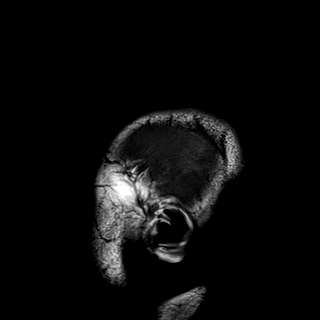
[im 31/31]
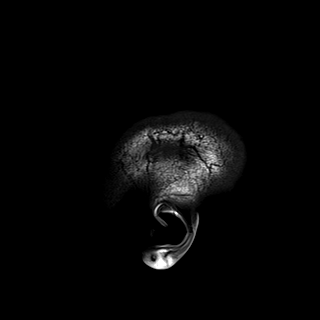

[Series 6: DWI · axial · 3.0mm · 0.94mm/px · z∈[-70,+72]mm · 10 of 164 slices shown (1 of 3)]
[im 1/164]
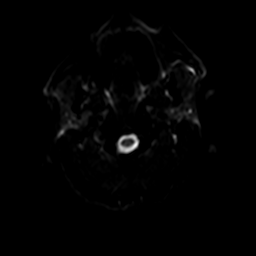
[im 19/164]
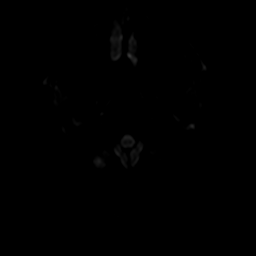
[im 37/164]
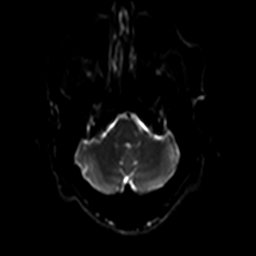
[im 55/164]
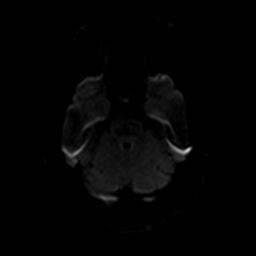
[im 73/164]
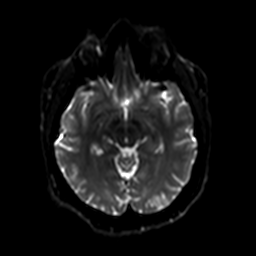
[im 91/164]
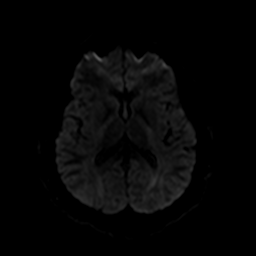
[im 109/164]
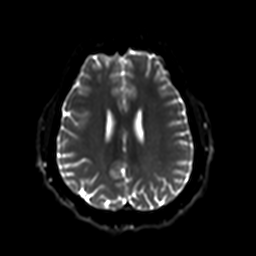
[im 127/164]
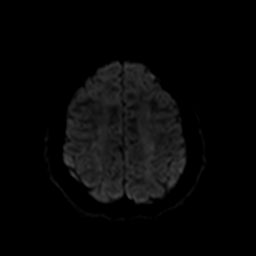
[im 145/164]
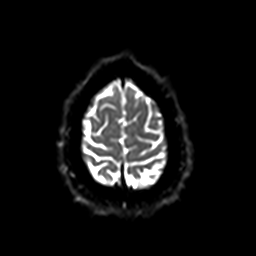
[im 164/164]
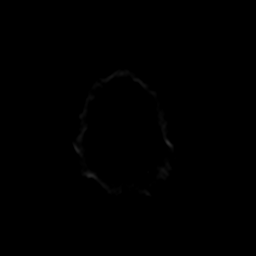

[Series 7: ax dwi_tracew · axial · 3.0mm · 0.94mm/px · z∈[-70,+72]mm · 5 of 82 slices shown]
[im 1/82]
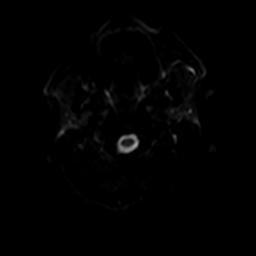
[im 21/82]
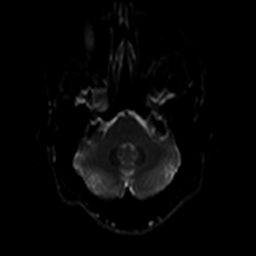
[im 41/82]
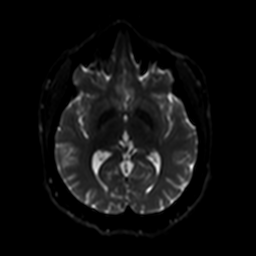
[im 61/82]
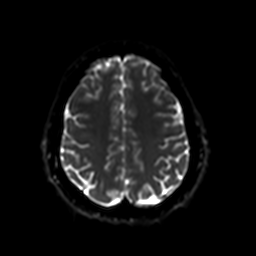
[im 82/82]
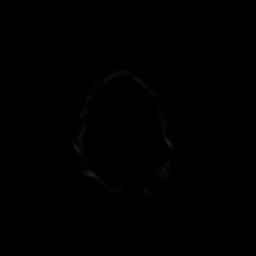

[Series 8: ax dwi_adc · axial · 3.0mm · 0.94mm/px · z∈[-70,+72]mm · 3 of 40 slices shown]
[im 1/40]
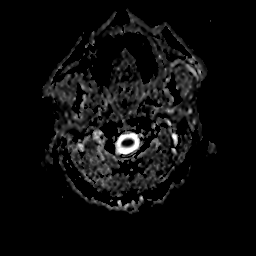
[im 20/40]
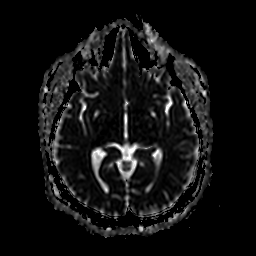
[im 40/40]
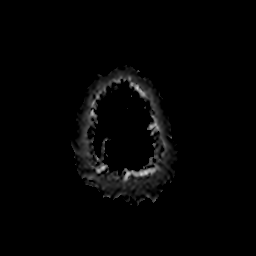

[Series 9: DWI · coronal · 5.0mm · 1.44mm/px · 4 of 62 slices shown (2 of 3)]
[im 1/62]
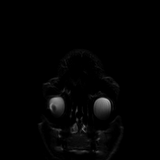
[im 21/62]
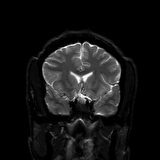
[im 41/62]
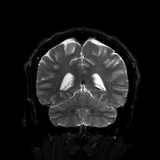
[im 62/62]
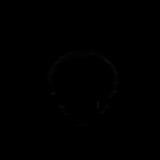

[Series 10: DWI · coronal · 5.0mm · 1.44mm/px · 2 of 31 slices shown (3 of 3)]
[im 1/31]
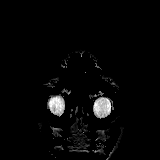
[im 31/31]
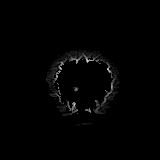

[Series 11: FLAIR · axial · 3.0mm · 0.72mm/px · z∈[-71,+78]mm · 2 of 26 slices shown]
[im 1/26]
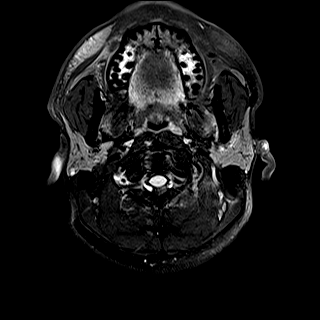
[im 26/26]
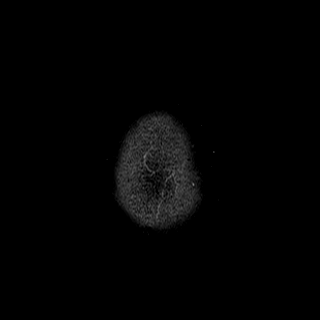

[Series 12: T2 · axial · 4.0mm · 0.36mm/px · z∈[-68,+76]mm · 2 of 29 slices shown (1 of 2)]
[im 1/29]
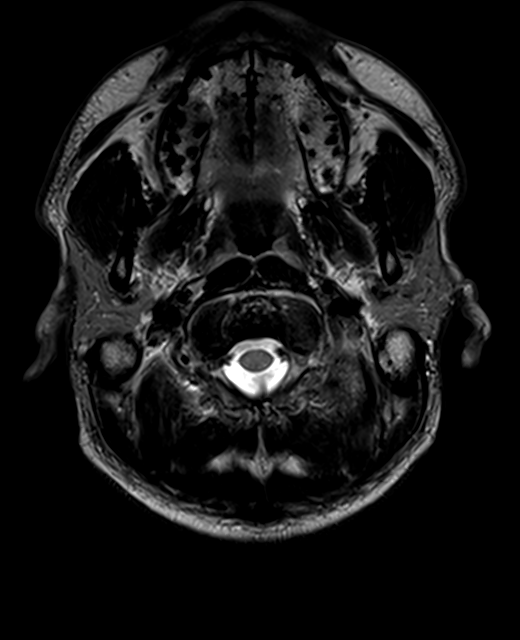
[im 29/29]
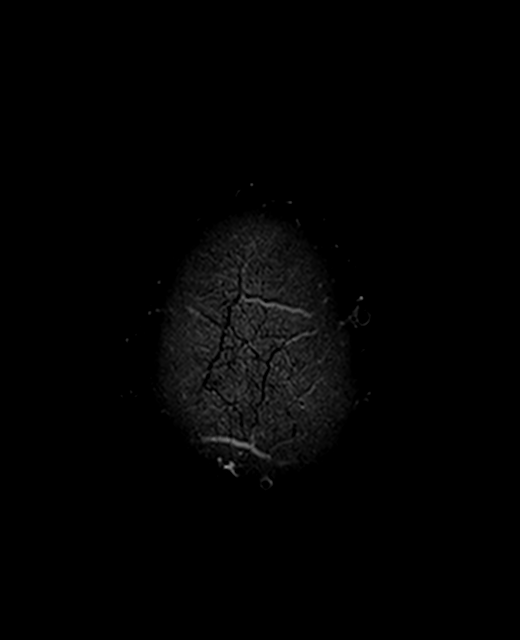

[Series 13: swi_images · axial · 1.5mm · 0.90mm/px · z∈[-66,+75]mm · 6 of 96 slices shown]
[im 1/96]
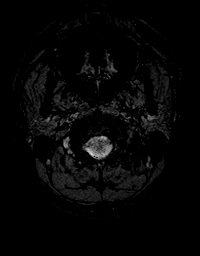
[im 20/96]
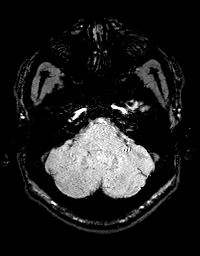
[im 39/96]
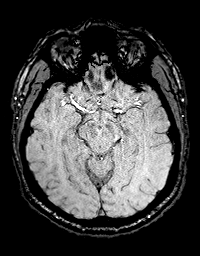
[im 58/96]
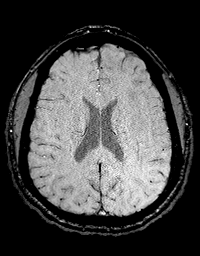
[im 77/96]
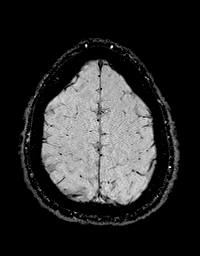
[im 96/96]
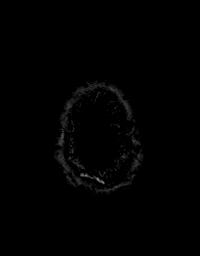

[Series 15: T1 · axial · 1.0mm · 0.94mm/px · z∈[-75,+82]mm · 10 of 160 slices shown (2 of 2)]
[im 1/160]
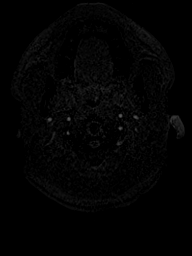
[im 18/160]
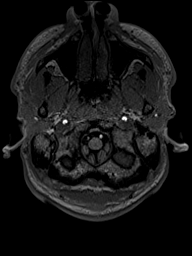
[im 36/160]
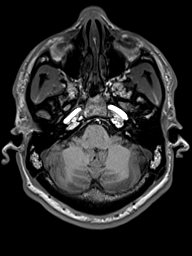
[im 54/160]
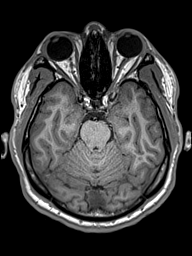
[im 71/160]
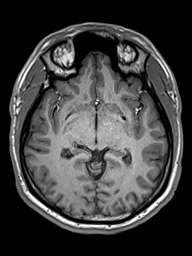
[im 89/160]
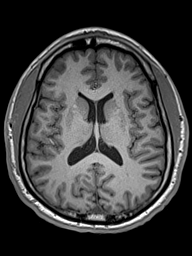
[im 107/160]
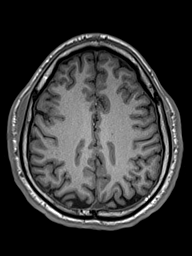
[im 124/160]
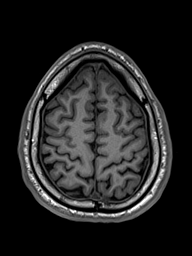
[im 142/160]
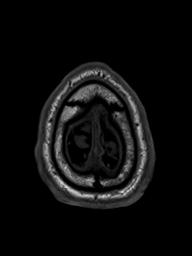
[im 160/160]
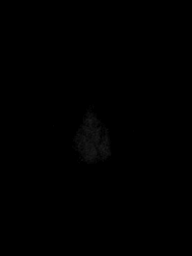

[Series 16: T2 · coronal · 4.5mm · 0.36mm/px · 2 of 30 slices shown (2 of 2)]
[im 1/30]
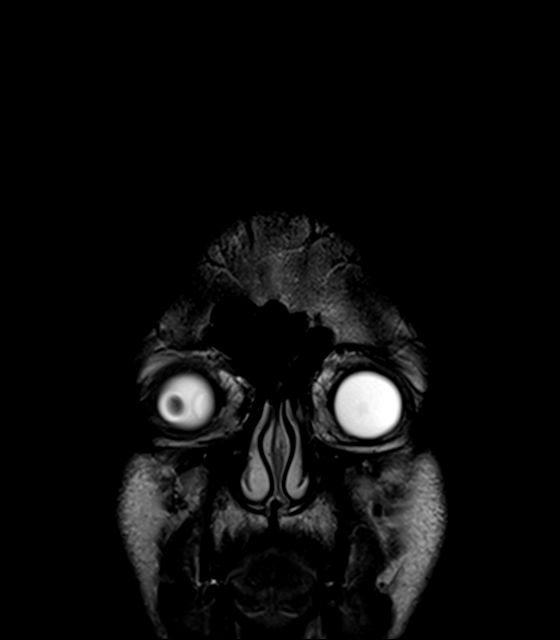
[im 30/30]
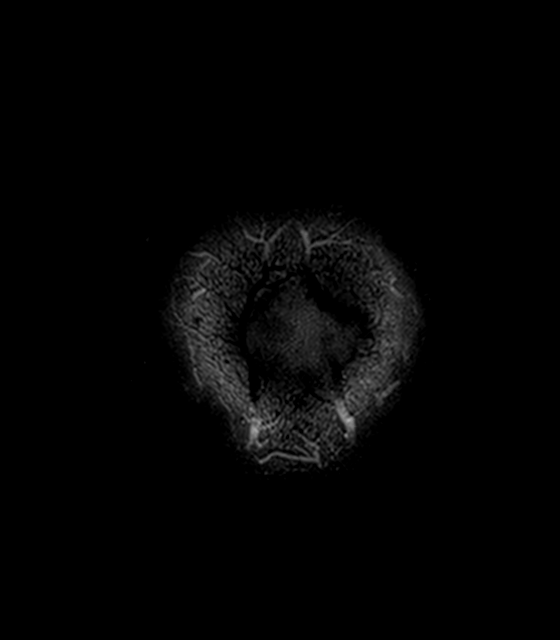

[48 of 48 positions shown; findings below may reference images not displayed]

FINDINGS: Brain: There is no acute infarction or intracranial hemorrhage.
There is no intracranial mass, mass effect, or edema. There is no
hydrocephalus or extra-axial fluid collection. Ventricles and sulci
are normal in size and configuration.

Vascular: Major vessel flow voids at the skull base are preserved.

Skull and upper cervical spine: Normal marrow signal is preserved.

Sinuses/Orbits: Minimal patchy paranasal sinus mucosal thickening.
Orbits are unremarkable.

Other: Sella is unremarkable.  Mastoid air cells are clear.
IMPRESSION: No evidence of recent infarction, hemorrhage, mass, or other
significant abnormality.
